# Patient Record
Sex: Male | Born: 1955 | Race: White | Hispanic: No | Marital: Married
Health system: Southern US, Community
[De-identification: ages and names within clinical notes are randomized; demographics above are authoritative.]

---

## 2018-03-18 ENCOUNTER — Emergency Department (HOSPITAL_COMMUNITY): Payer: BLUE CROSS/BLUE SHIELD

## 2018-03-18 ENCOUNTER — Encounter (HOSPITAL_COMMUNITY): Payer: Self-pay | Admitting: Emergency Medicine

## 2018-03-18 ENCOUNTER — Emergency Department (HOSPITAL_COMMUNITY)
Admission: EM | Admit: 2018-03-18 | Discharge: 2018-03-18 | Disposition: A | Discharge status: Home / Self Care | Payer: BLUE CROSS/BLUE SHIELD | Attending: Emergency Medicine | Admitting: Emergency Medicine

## 2018-03-18 DIAGNOSIS — R079 Chest pain, unspecified: Secondary | ICD-10-CM | POA: Insufficient documentation

## 2018-03-18 DIAGNOSIS — Z7982 Long term (current) use of aspirin: Secondary | ICD-10-CM | POA: Diagnosis not present

## 2018-03-18 DIAGNOSIS — R531 Weakness: Secondary | ICD-10-CM

## 2018-03-18 DIAGNOSIS — Z79899 Other long term (current) drug therapy: Secondary | ICD-10-CM | POA: Insufficient documentation

## 2018-03-18 DIAGNOSIS — R1013 Epigastric pain: Secondary | ICD-10-CM | POA: Insufficient documentation

## 2018-03-18 DIAGNOSIS — R1084 Generalized abdominal pain: Secondary | ICD-10-CM

## 2018-03-18 LAB — COMPREHENSIVE METABOLIC PANEL
ALT: 41 U/L (ref 0–44)
ANION GAP: 12 (ref 5–15)
AST: 31 U/L (ref 15–41)
Albumin: 4 g/dL (ref 3.5–5.0)
Alkaline Phosphatase: 73 U/L (ref 38–126)
BUN: 13 mg/dL (ref 8–23)
CALCIUM: 9.4 mg/dL (ref 8.9–10.3)
CO2: 21 mmol/L — ABNORMAL LOW (ref 22–32)
CREATININE: 1.13 mg/dL (ref 0.61–1.24)
Chloride: 104 mmol/L (ref 98–111)
GFR calc non Af Amer: >60 mL/min (ref >60)
Glucose, Bld: 131 mg/dL — ABNORMAL HIGH (ref 70–99)
Potassium: 4.1 mmol/L (ref 3.5–5.1)
Sodium: 137 mmol/L (ref 135–145)
Total Bilirubin: 1.3 mg/dL — ABNORMAL HIGH (ref 0.3–1.2)
Total Protein: 6.9 g/dL (ref 6.5–8.1)

## 2018-03-18 LAB — CBC WITH DIFFERENTIAL/PLATELET
Abs Immature Granulocytes: 0.04 10*3/uL (ref 0.00–0.07)
Basophils Absolute: 0.1 10*3/uL (ref 0.0–0.1)
Basophils Relative: 1 %
Eosinophils Absolute: 0.1 10*3/uL (ref 0.0–0.5)
Eosinophils Relative: 1 %
HCT: 47.2 % (ref 39.0–52.0)
Hemoglobin: 16.3 g/dL (ref 13.0–17.0)
Immature Granulocytes: 1 %
Lymphocytes Relative: 20 %
Lymphs Abs: 1.6 10*3/uL (ref 0.7–4.0)
MCH: 30 pg (ref 26.0–34.0)
MCHC: 34.5 g/dL (ref 30.0–36.0)
MCV: 86.9 fL (ref 80.0–100.0)
Monocytes Absolute: 1 10*3/uL (ref 0.1–1.0)
Monocytes Relative: 12 %
NRBC: 0 % (ref 0.0–0.2)
Neutro Abs: 5.5 10*3/uL (ref 1.7–7.7)
Neutrophils Relative %: 65 %
Platelets: 157 10*3/uL (ref 150–400)
RBC: 5.43 MIL/uL (ref 4.22–5.81)
RDW: 12.9 % (ref 11.5–15.5)
WBC: 8.3 10*3/uL (ref 4.0–10.5)

## 2018-03-18 LAB — URINALYSIS, MICROSCOPIC (REFLEX)
Squamous Epithelial / HPF: NONE SEEN (ref 0–5)
WBC, UA: NONE SEEN WBC/hpf (ref 0–5)

## 2018-03-18 LAB — I-STAT TROPONIN, ED
Troponin i, poc: 0.01 ng/mL (ref 0.00–0.08)
Troponin i, poc: 0 ng/mL (ref 0.00–0.08)

## 2018-03-18 LAB — URINALYSIS, ROUTINE W REFLEX MICROSCOPIC
Bilirubin Urine: NEGATIVE
Glucose, UA: NEGATIVE mg/dL
Ketones, ur: NEGATIVE mg/dL
Leukocytes, UA: NEGATIVE
Nitrite: NEGATIVE
PH: 5.5 (ref 5.0–8.0)
Protein, ur: NEGATIVE mg/dL

## 2018-03-18 LAB — I-STAT CG4 LACTIC ACID, ED: Lactic Acid, Venous: 2.16 mmol/L (ref 0.5–1.9)

## 2018-03-18 LAB — LIPASE, BLOOD: Lipase: 35 U/L (ref 11–51)

## 2018-03-18 MED ORDER — IOPAMIDOL (ISOVUE-370) INJECTION 76%
INTRAVENOUS | Status: AC
  Administered 2018-03-18: 100 mL
  Filled 2018-03-18: qty 100

## 2018-03-18 NOTE — ED Triage Notes (Signed)
Arrives from a hotel, travelling from [place] to [place], had abdominal pain, hypertension found by EMS. Hx of AAA, no abdominal tenderness or pain, 12 lead unremarkable, no chest pain or SOB

## 2018-03-18 NOTE — ED Provider Notes (Signed)
[facility]ENC[facility] Provider Note   CSN: [id number] Arrival date & time: 03/18/18  1435     History   Chief Complaint Chief Complaint  Patient presents with  . Abdominal Pain  . Hypertension    HPI Brandon Carey is a 62 y.o. male.  The history is provided by the patient.  Abdominal Pain   This is a new problem. The current episode started yesterday. The problem occurs constantly. Progression since onset: waxing and waning. The pain is associated with an unknown (History of extensive AAA repair at [facility]. ) factor. The pain is located in the epigastric region. The quality of the pain is aching and dull. The pain is at a severity of 2/10. The pain is mild. Pertinent negatives include anorexia, fever, belching, diarrhea, hematochezia, melena, nausea, vomiting, constipation, dysuria, frequency, hematuria, headaches, arthralgias and myalgias. Nothing aggravates the symptoms. Nothing relieves the symptoms. Past workup includes surgery. His past medical history does not include GERD. Past medical history comments: AAA.    History reviewed. No pertinent past medical history.  There are no active problems to display for this patient.   History reviewed. No pertinent surgical history.      Home Medications    Prior to Admission medications   Medication Sig Start Date End Date Taking? Authorizing Provider  aspirin EC 81 MG tablet Take 81 mg by mouth 2 (two) times daily.   Yes [provider]  atorvastatin (LIPITOR) 40 MG tablet Take 40 mg by mouth at bedtime. 02/21/18  Yes [provider]  docusate sodium (COLACE) 100 MG capsule Take 100 mg by mouth daily as needed for mild constipation.  09/30/09  Yes [provider]  ibuprofen (ADVIL,MOTRIN) 200 MG tablet Take 200 mg by mouth every 6 (six) hours as needed (for pain).   Yes [provider]  metoprolol tartrate (LOPRESSOR) 25 MG tablet Take 25 mg by mouth 2  (two) times daily. 05/16/16  Yes [provider]    Family History No family history on file.  Social History Social History   Tobacco Use  . Smoking status: Not on file  Substance Use Topics  . Alcohol use: Not on file  . Drug use: Not on file     Allergies   Patient has no known allergies.   Review of Systems Review of Systems  Constitutional: Negative for chills and fever.  HENT: Negative for ear pain and sore throat.   Eyes: Negative for pain and visual disturbance.  Respiratory: Negative for cough and shortness of breath.   Cardiovascular: Negative for chest pain, palpitations and leg swelling.  Gastrointestinal: Positive for abdominal pain. Negative for anorexia, constipation, diarrhea, hematochezia, melena, nausea and vomiting.  Genitourinary: Negative for dysuria, frequency and hematuria.  Musculoskeletal: Negative for arthralgias, back pain and myalgias.  Skin: Negative for color change and rash.  Neurological: Positive for weakness (in his legs bilaterally, now resolved). Negative for dizziness, seizures, syncope, facial asymmetry, speech difficulty, light-headedness, numbness and headaches.  All other systems reviewed and are negative.    Physical Exam Updated Vital Signs  ED Triage Vitals  Enc Vitals Group     BP 03/18/18 1439 (!) 173/82     Pulse Rate 03/18/18 1439 73     Resp 03/18/18 1439 16     Temp 03/18/18 1439 98.6 F (37 C)     Temp Source 03/18/18 1439 Oral     SpO2 03/18/18 1439 97 %     Weight  03/18/18 1438 240 lb (108.9 kg)     Height 03/18/18 1438 5\' 8"  (1.727 m)     Head Circumference --      Peak Flow --      Pain Score 03/18/18 1438 0     Pain Loc --      Pain Edu? --      Excl. in GC? --     Physical Exam Vitals signs and nursing note reviewed.  Constitutional:      General: He is not in acute distress.    Appearance: He is well-developed. He is not toxic-appearing.  HENT:     Head: Normocephalic and atraumatic.    Eyes:     Extraocular Movements: Extraocular movements intact.     Conjunctiva/sclera: Conjunctivae normal.     Pupils: Pupils are equal, round, and reactive to light.  Neck:     Musculoskeletal: Neck supple.  Cardiovascular:     Rate and Rhythm: Normal rate and regular rhythm.     Heart sounds: Normal heart sounds. No murmur.  Pulmonary:     Effort: Pulmonary effort is normal. No respiratory distress.     Breath sounds: Normal breath sounds.  Abdominal:     General: A surgical scar is present. Bowel sounds are normal. There is no distension.     Palpations: Abdomen is soft.     Tenderness: There is abdominal tenderness in the epigastric area. There is no guarding or rebound. Negative signs include Murphy's sign and Rovsing's sign.  Skin:    General: Skin is warm and dry.     Capillary Refill: Capillary refill takes less than 2 seconds.  Neurological:     General: No focal deficit present.     Mental Status: He is alert and oriented to person, place, and time.     Cranial Nerves: No cranial nerve deficit.     Motor: No weakness.     Comments: 5+/5 strength, normal sensation, normal finger to nose finger, able to stand without any issues, normal speech  Psychiatric:        Mood and Affect: Mood normal.      ED Treatments / Results  Labs (all labs ordered are listed, but only abnormal results are displayed) Labs Reviewed  COMPREHENSIVE METABOLIC PANEL - Abnormal; Notable for the following components:      Result Value   CO2 21 (*)    Glucose, Bld 131 (*)    Total Bilirubin 1.3 (*)    All other components within normal limits  I-STAT CG4 LACTIC ACID, ED - Abnormal; Notable for the following components:   Lactic Acid, Venous 2.16 (*)    All other components within normal limits  LIPASE, BLOOD  CBC WITH DIFFERENTIAL/PLATELET  URINALYSIS, ROUTINE W REFLEX MICROSCOPIC  I-STAT TROPONIN, ED    EKG EKG Interpretation  Date/Time:  Saturday March 18 2018 14:39:46  EST Ventricular Rate:  72 PR Interval:    QRS Duration: 101 QT Interval:  368 QTC Calculation: 403 R Axis:   46 Text Interpretation:  Sinus rhythm Nonspecific T abnormalities, diffuse leads no prior to compare to Confirmed by Virgina NorfolkAdam, Reina Wilton [id number]) on 03/18/2018 4:27:29 PM   Radiology Dg Chest Portable 1 View  Result Date: 03/18/2018 CLINICAL DATA:  Per patient mid upper abdominal pain for about 3hours.chest pain EXAM: PORTABLE CHEST 1 VIEW COMPARISON:  None. FINDINGS: Sternotomy wires overlie normal cardiac silhouette. Aortic stent graft noted. No effusion, infiltrate pneumothorax. No acute osseous abnormality. IMPRESSION: No acute cardiopulmonary process. Electronically  Signed   By: Genevive Bi M.D.   On: 03/18/2018 15:12    Procedures Procedures (including critical care time)  Medications Ordered in ED Medications  iopamidol (ISOVUE-370) 76 % injection (100 mLs  Contrast Given 03/18/18 1558)     Initial Impression / Assessment and Plan / ED Course  I have reviewed the triage vital signs and the nursing notes.  Pertinent labs & imaging results that were available during my care of the patient were reviewed by me and considered in my medical decision making (see chart for details).     Brandon Carey is a 62 year old male with history of hypertension, AAA status post repair who presents to the ED with abdominal pain.  Patient with mild hypertension.  Patient with no fever.  Otherwise normal vitals.  Patient states that he has had vague epigastric abdominal pain for the last day and a half.  Patient with mild epigastric abdominal pain while driving down to [place] for vacation today.  He states that while he was driving his legs started to feel weak.  When he pulled over he felt like both of his legs are weak and is having difficulty with standing.  He states that abdominal pain started to get worse.  He denies any nausea, vomiting, diarrhea.  He states some mild chest pain  yesterday but has now resolved.  Overall patient states now that he is fairly asymptomatic.  He has normal strength and sensation in his legs.  He is able to stand without any issues.  He has normal neurological exam.  History and physical is not consistent with a TIA.  Patient has strong pulses throughout.  Blood pressure was equal in both upper extremities.  Patient had a recent CTA of his chest abdomen and pelvis 4 months ago at [facility] that showed stable process.  He has no cough, sputum production.  EKG shows sinus rhythm.  T wave inversions inferiorly and laterally.  No prior EKG to compare to.  Patient with troponin within normal limits.  No significant anemia, electrolyte abnormality, kidney injury.  Patient with mild lactic acidosis at 2.16.  Patient with mostly epigastric abdominal pain on exam.  Given his history will obtain a CTA of his chest abdomen and pelvis to further evaluate for any dissection/leak.  Patient handed off to oncoming ED staff with patient pending CTA of his chest abdomen pelvis.  Patient continues to be asymptomatic throughout my care.  Will get delta troponin.  Low concern for ACS.  Patient had heart cath that was unremarkable 2013.  If CTA and troponin are normal patient safe for discharge.  Patient is agreeable to this plan.  He states that he is now starting to pass more gas and feeling better as well. Please see Dr. Priscille Loveless note for further results, eval, dispo of patient.   This chart was dictated using voice recognition software.  Despite best efforts to proofread,  errors can occur which can change the documentation meaning.   Final Clinical Impressions(s) / ED Diagnoses   Final diagnoses:  Generalized abdominal pain    ED Discharge Orders    None       Virgina Norfolk, DO 03/18/18 1810

## 2018-03-18 NOTE — ED Provider Notes (Signed)
6:34 PM Patient is alert, has no ongoing complaints, notes complete resolution of his lower extremity weakness which was present earlier today. I reviewed the patient's CT scan, and discussed it with our vascular surgery colleague. We reviewed the patient's intervention, today's findings; graft is patent. However, there is some consideration of possible endoleak, though this does not require emergent intervention. Patient provided copies of today's CT results, will contact his vascular surgery team in [place] for further evaluation, monitoring. Patient's other studies also now resulted, with normal second troponin, and with no ongoing symptoms, 2 normal troponin, there is low suspicion for atypical ACS. Patient discharged to follow-up with his care team in [place].    Gerhard MunchLockwood, Amed Datta, MD 03/18/18 [phone]

## 2018-03-18 NOTE — Discharge Instructions (Signed)
As discussed, your evaluation today has been largely reassuring.  But, it is important that you monitor your condition carefully, and do not hesitate to return to the ED if you develop new, or concerning changes in your condition.  Please be sure to contact your surgeon in [place] with today's study results, to ensure appropriate ongoing care.

## 2018-03-18 NOTE — ED Notes (Signed)
PT states understanding of care given, follow up care. PT ambulated from ED to car with a steady gait.  

## 2020-01-31 IMAGING — CT CT ANGIO CHEST-ABD-PELV FOR DISSECTION W/ AND WO/W CM
2 of 8 series · 12 of 46 positions shown, 14 images · IV contrast (OMNI)
Comparison: No comparison.

CLINICAL DATA: Abdominal pain. History of abdominal aortic
aneurysm.

EXAM:
CT ANGIOGRAPHY CHEST, ABDOMEN AND PELVIS
TECHNIQUE: Multidetector CT imaging through the chest, abdomen and pelvis was
performed using the standard protocol during bolus administration of
intravenous contrast. Multiplanar reconstructed images and MIPs were
obtained and reviewed to evaluate the vascular anatomy.
CONTRAST:  100mL NF9YH8-M2P IOPAMIDOL (NF9YH8-M2P) INJECTION 76%

[Series 6: dissection 3.0 i30f 3 · axial · 0.95mm/px · z∈[+926,+1457]mm · 9 of 217 slices shown, 11 images]
[im 20/217  soft-tissue]
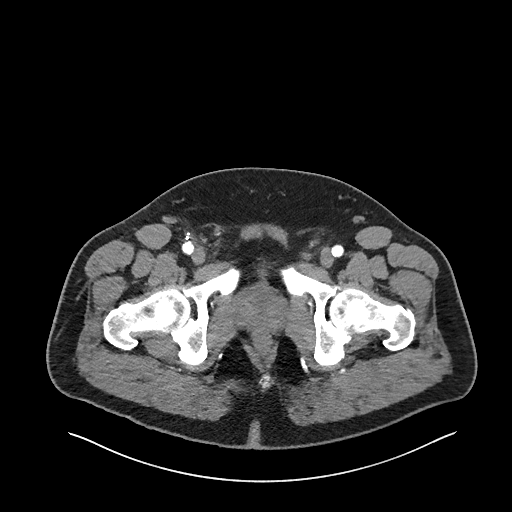
[im 20/217  bone]
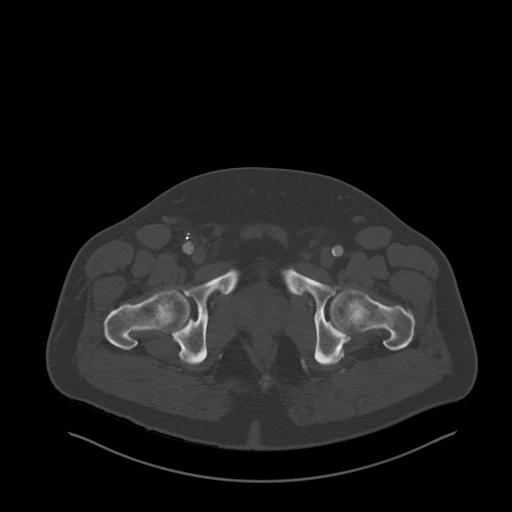
[im 40/217  soft-tissue]
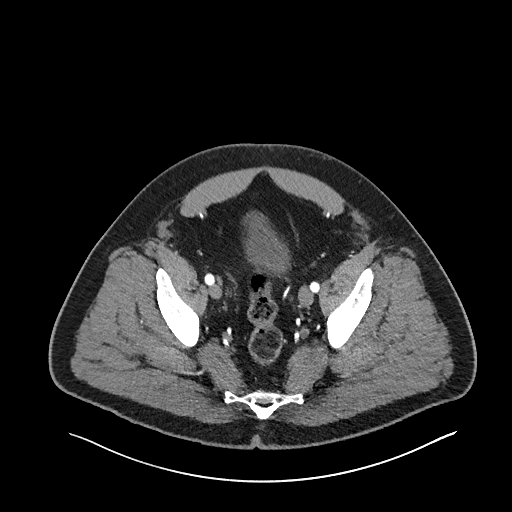
[im 59/217  soft-tissue]
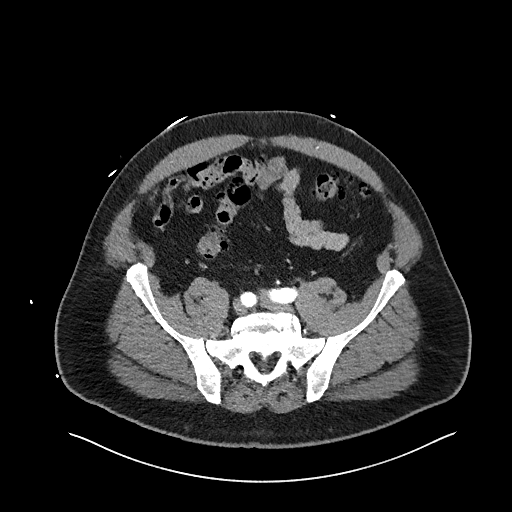
[im 89/217  soft-tissue]
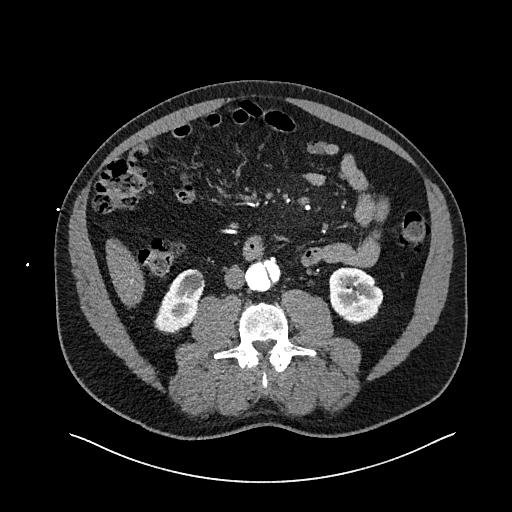
[im 109/217  soft-tissue]
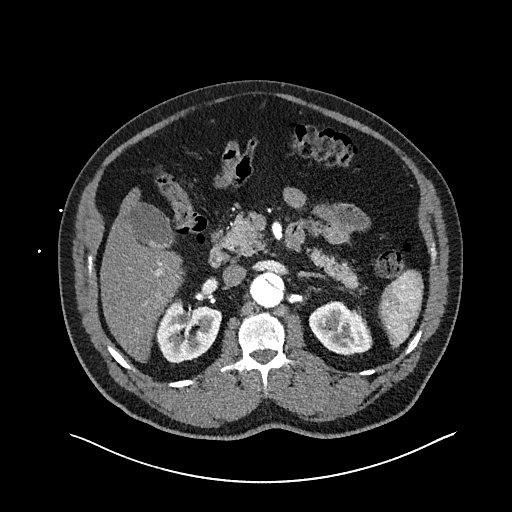
[im 128/217  soft-tissue]
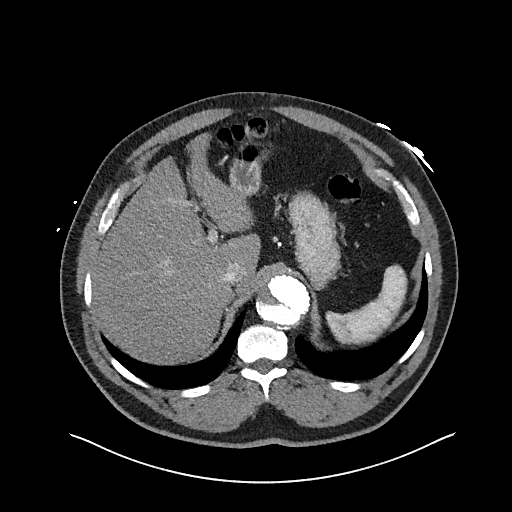
[im 158/217  soft-tissue]
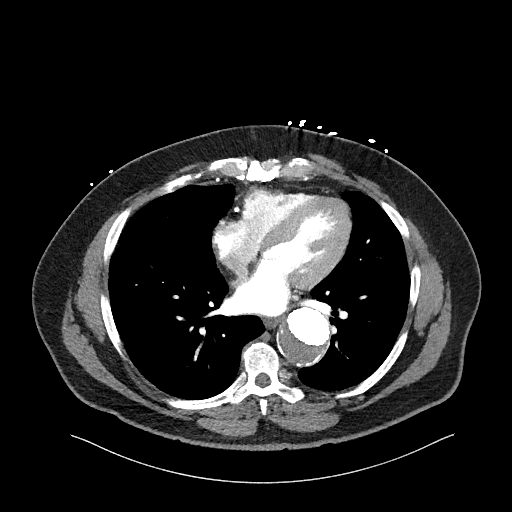
[im 177/217  soft-tissue]
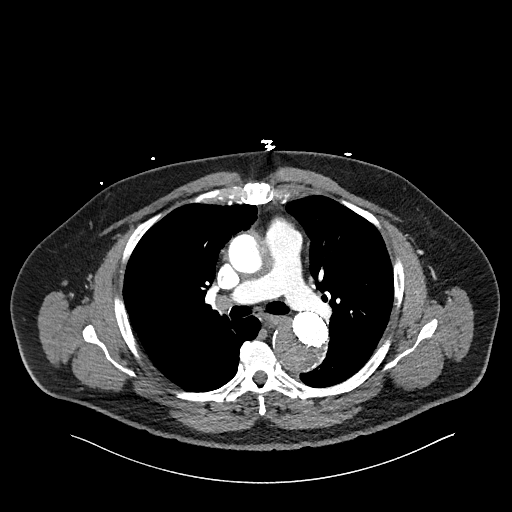
[im 197/217  soft-tissue]
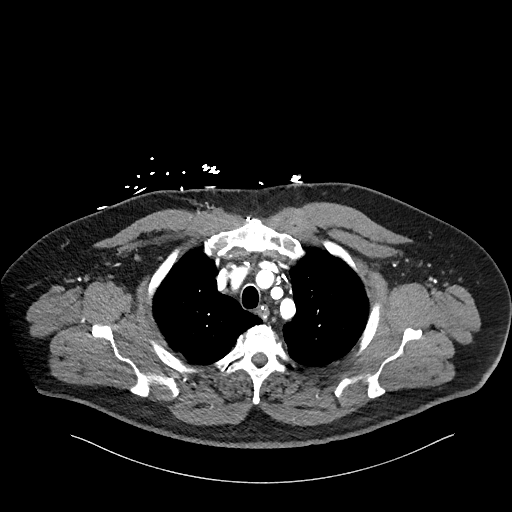
[im 197/217  bone]
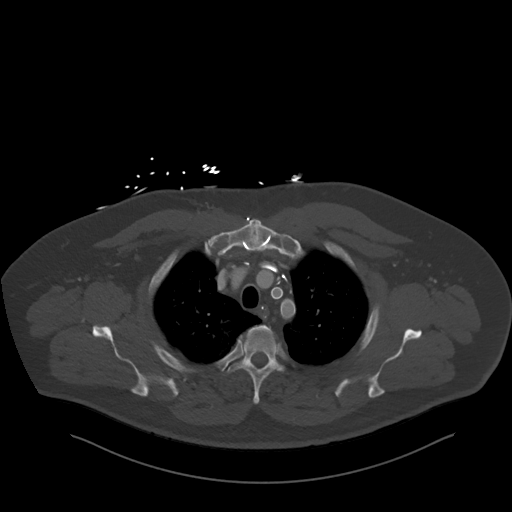

[Series 9: coronals · coronal · 0.95mm/px · 3 of 205 slices shown]
[im 52/205  soft-tissue]
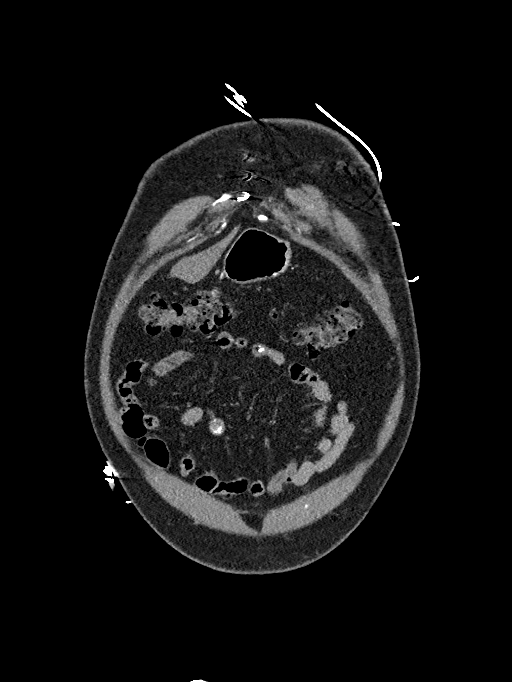
[im 103/205  soft-tissue]
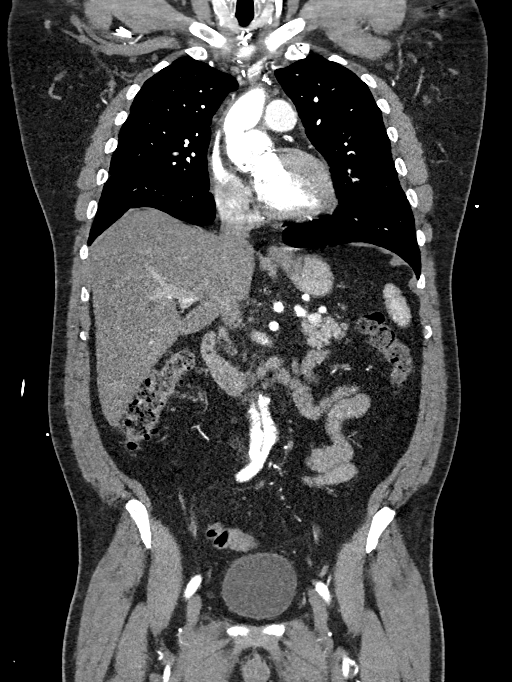
[im 154/205  soft-tissue]
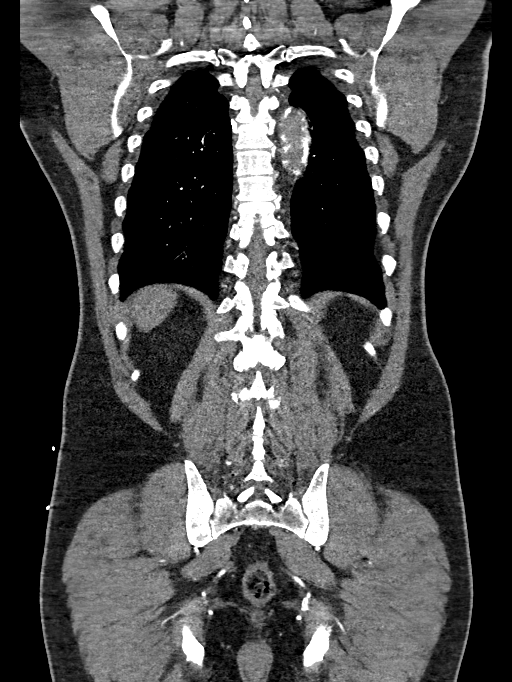

[12 of 46 positions shown; findings below may reference images not displayed]

Report for CT a performed at [REDACTED] on 11/18/2017 has been reviewed in [REDACTED] [REDACTED]
FINDINGS: CTA CHEST FINDINGS

Cardiovascular: The heart size is normal. No substantial pericardial
effusion. Coronary artery calcification is evident. Patient is
status post aortic valve replacement and ascending aortic root
graft. Fenestrated endograft is identified in the proximal
transverse aorta with stenting of the proximal left common carotid
and left subclavian arteries. The right brachiocephalic, left common
carotid and left subclavian arteries are widely patent. The thoracic
aortic endograft is widely patent.

Contrast enhanced blood is identified in the false lumen of the
lower thoracic aortic aneurysm. Maximum transverse orthogonal
diameters of the lower descending thoracic aorta are 5.2 x 5.2 cm.
The opacified blood in the false lumen of the distal thoracic is of
indeterminate origin. The stent graft in the true lumen ends just
proximal to the celiac axis and opacification of blood pool in the
true lumen and false lumen just below the into the stent graft is
very similar suggesting that there may be a fenestration in the
dissection flap below the level of the stent graft with retrograde
flow of contrast up into the false lumen of the thoracic aorta. Flow
of contrast material from an intercostal vessel into the false lumen
of the lower thoracic aorta is also possibility (see image 95/6).

Mediastinum/Nodes: No mediastinal lymphadenopathy. There is no hilar
lymphadenopathy. The esophagus has normal imaging features. There is
no axillary lymphadenopathy.

Lungs/Pleura: The central tracheobronchial airways are patent. 10 x
12 mm right lower lobe nodule (69/7). No suspicious nodule or mass
in the left lung. No pleural effusion.

Musculoskeletal: No worrisome lytic or sclerotic osseous
abnormality.

Review of the MIP images confirms the above findings.

CTA ABDOMEN AND PELVIS FINDINGS

VASCULAR

Aorta: Stent graft in the true lumen of the thoracic aortic
dissection and just proximal to the celiac axis. As described above,
opacification of the false lumen of the dissection just below the
level of the distal end of the stent graft is similar to the blood
pool opacification in the true lumen.

Celiac: Celiac axis arises from the true lumen and is widely patent.

SMA: Widely patent, arising from the true lumen.

Renals: Dissection flap extends into the ostium of the right renal
artery with flow into the right renal artery arises from the true
lumen and false lumen. Dissection flap also involves the proximal
left renal artery which does opacify period.

IMA: The IMA arises from the true lumen and appears patent.

Inflow: Dissection flap involves the proximal right common iliac
artery which appears widely patent distal to the termination of the
dissection. Left common iliac artery arises from the true lumen and
is widely patent.

Veins: Not well assessed due to bolus timing.

Review of the MIP images confirms the above findings.

NON-VASCULAR

Hepatobiliary: No focal abnormality within the liver parenchyma.
Tiny layering gallstones evident. No intrahepatic or extrahepatic
biliary dilation.

Pancreas: No focal mass lesion. No dilatation of the main duct. No
intraparenchymal cyst. No peripancreatic edema.

Spleen: No splenomegaly. No focal mass lesion.

Adrenals/Urinary Tract: No adrenal nodule or mass. 19 mm cyst
identified lower pole right kidney. 17-18 mm cyst identified lower
enter polar region of the left kidney. No evidence for hydroureter.
The urinary bladder appears normal for the degree of distention.

Stomach/Bowel: Stomach is nondistended. No gastric wall thickening.
No evidence of outlet obstruction. Duodenum is normally positioned
as is the ligament of Treitz. No small bowel wall thickening. No
small bowel dilatation. The terminal ileum is normal. The appendix
is normal. No gross colonic mass. No colonic wall thickening.
Diverticuli are seen scattered along the entire length of the colon
without CT findings of diverticulitis.

Lymphatic: No abdominal lymphadenopathy. No pelvic sidewall
lymphadenopathy..

Reproductive: Prostate gland appears mildly enlarged.

Other: No intraperitoneal free fluid.

Musculoskeletal: No worrisome lytic or sclerotic osseous
abnormality.

Review of the MIP images confirms the above findings.
IMPRESSION: 1. Patient is status post aortic valve replacement and graft of the
ascending thoracic aorta. Also with endograft treatment for type a
thoracic aortic dissection. The graft starts just distal to the
origin of the brachiocephalic artery with fenestration and stenting
of the proximal left common carotid and left subclavian arteries,
both of which appear widely patent. The graft is widely patent and
extends down to the proximal abdominal aorta just above the celiac
origin.
2. Flow of opacified blood observed in the false lumen with maximum
measurable diameter of the lower thoracic aortic aneurysm measuring
5.2 x 5.2 cm. Without comparison imaging, assessment for expansion
of the aneurysm sac is not possible. Flow of opacified blood may
arise below the graft via a fenestration in the dissection flap with
retrograde flow back up into the upper abdominal and lower thoracic
false lumen. Given the close match of attenuation between the true
and false lumen of the abdominal aorta just below the distal end of
the graft, this is the favored origin of the flow into the false
lumen. However, multiple opacified intercostal vessels are
identified in the lower thoracic region and type II endoleak from
intercostal origin is not excluded.
3. No evidence for edema or hemorrhage around the thoraco abdominal
aorta to suggest extra aortic leak.
4. Direct comparison to previous imaging studies is recommended for
complete assessment.
5. Cholelithiasis.
6. 10 x 12 mm right lower lobe peripheral nodule may be atelectatic.
Consider one of the following in 3 months for both low-risk and
high-risk individuals: (a) repeat chest CT, (b) follow-up PET-CT, or
(c) tissue sampling. This recommendation follows the consensus
statement: Guidelines for Management of Incidental Pulmonary Nodules
Detected on CT Images: From the [HOSPITAL] 6077; Radiology

## 2020-01-31 IMAGING — DX DG CHEST 1V PORT
1 series · 1 of 1 positions shown · non-contrast
Comparison: None.

CLINICAL DATA: Per patient mid upper abdominal pain for about
6hours.chest pain

EXAM:
PORTABLE CHEST 1 VIEW

[chest ap]
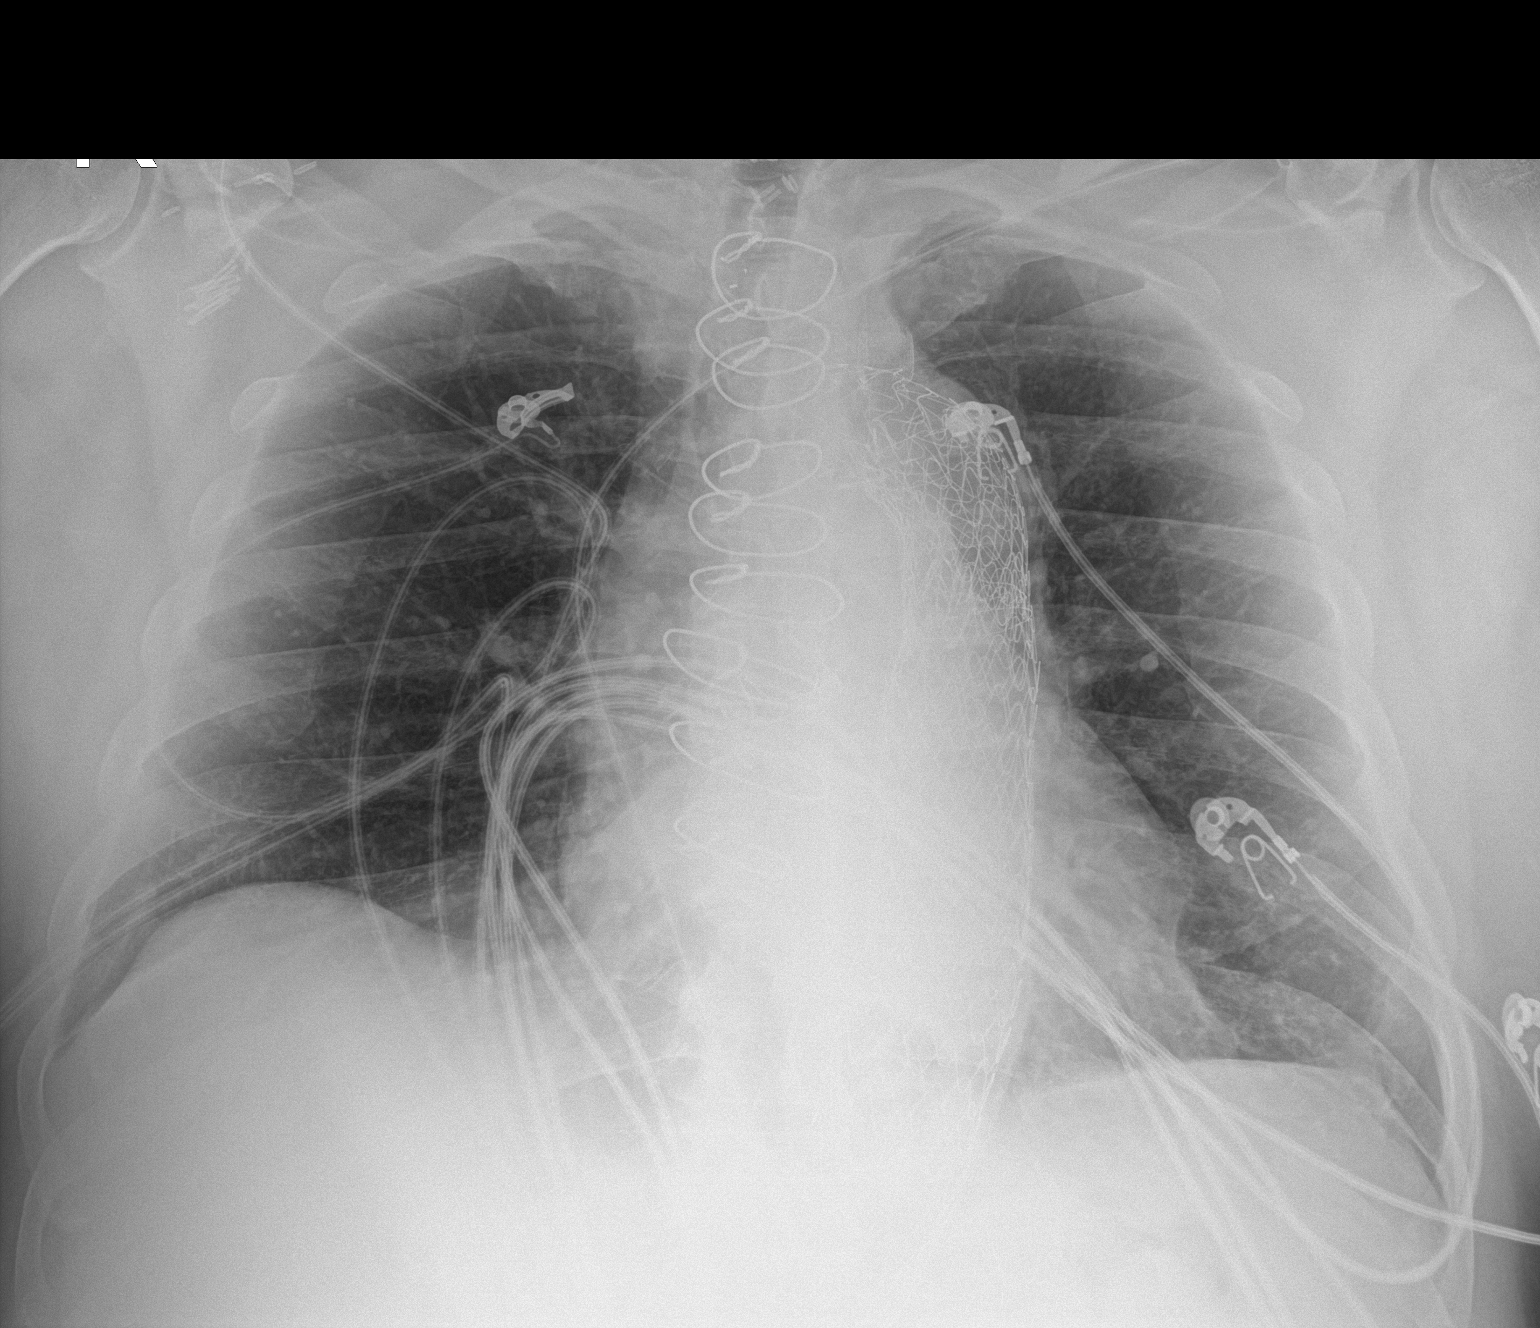

[1 of 1 positions shown; findings below may reference images not displayed]

FINDINGS: Sternotomy wires overlie normal cardiac silhouette. Aortic stent
graft noted. No effusion, infiltrate pneumothorax. No acute osseous
abnormality.
IMPRESSION: No acute cardiopulmonary process.
